# Patient Record
Sex: Male | Born: 1970 | Race: White | Hispanic: No | Marital: Married | Smoking: Former smoker
Health system: Southern US, Community
[De-identification: ages and names within clinical notes are randomized; demographics above are authoritative.]

## PROBLEM LIST (undated history)

## (undated) DIAGNOSIS — K589 Irritable bowel syndrome without diarrhea: Secondary | ICD-10-CM

## (undated) DIAGNOSIS — K219 Gastro-esophageal reflux disease without esophagitis: Secondary | ICD-10-CM

## (undated) HISTORY — DX: Irritable bowel syndrome, unspecified: K58.9

---

## 1997-08-06 HISTORY — PX: APPENDECTOMY: SHX54

## 2010-08-06 HISTORY — PX: NASAL SEPTUM SURGERY: SHX37

## 2015-11-02 ENCOUNTER — Other Ambulatory Visit: Payer: Self-pay | Admitting: Internal Medicine

## 2015-11-02 DIAGNOSIS — M546 Pain in thoracic spine: Secondary | ICD-10-CM

## 2015-11-04 ENCOUNTER — Ambulatory Visit
Admission: RE | Admit: 2015-11-04 | Discharge: 2015-11-04 | Disposition: A | Discharge status: Home / Self Care | Payer: BLUE CROSS/BLUE SHIELD | Source: Ambulatory Visit | Attending: Internal Medicine | Admitting: Internal Medicine

## 2015-11-04 DIAGNOSIS — M546 Pain in thoracic spine: Secondary | ICD-10-CM | POA: Diagnosis present

## 2015-11-04 DIAGNOSIS — K7689 Other specified diseases of liver: Secondary | ICD-10-CM | POA: Diagnosis not present

## 2015-11-04 DIAGNOSIS — K8020 Calculus of gallbladder without cholecystitis without obstruction: Secondary | ICD-10-CM | POA: Insufficient documentation

## 2015-11-10 ENCOUNTER — Ambulatory Visit: Payer: Self-pay | Admitting: General Surgery

## 2015-11-10 ENCOUNTER — Encounter: Payer: Self-pay | Admitting: General Surgery

## 2015-11-10 ENCOUNTER — Ambulatory Visit (INDEPENDENT_AMBULATORY_CARE_PROVIDER_SITE_OTHER): Payer: BLUE CROSS/BLUE SHIELD | Admitting: General Surgery

## 2015-11-10 VITALS — BP 124/78 | HR 78 | Resp 12 | Ht 74.0 in | Wt 238.0 lb

## 2015-11-10 DIAGNOSIS — K8020 Calculus of gallbladder without cholecystitis without obstruction: Secondary | ICD-10-CM

## 2015-11-10 NOTE — Patient Instructions (Addendum)
The patient is aware to call back for any questions or concerns.  Laparoscopic Cholecystectomy Laparoscopic cholecystectomy is surgery to remove the gallbladder. The gallbladder is located in the upper right part of the abdomen, behind the liver. It is a storage sac for bile, which is produced in the liver. Bile aids in the digestion and absorption of fats. Cholecystectomy is often done for inflammation of the gallbladder (cholecystitis). This condition is usually caused by a buildup of gallstones (cholelithiasis) in the gallbladder. Gallstones can block the flow of bile, and that can result in inflammation and pain. In severe cases, emergency surgery may be required. If emergency surgery is not required, you will have time to prepare for the procedure. Laparoscopic surgery is an alternative to open surgery. Laparoscopic surgery has a shorter recovery time. Your common bile duct may also need to be examined during the procedure. If stones are found in the common bile duct, they may be removed. LET YOUR HEALTH CARE PROVIDER KNOW ABOUT:  Any allergies you have.  All medicines you are taking, including vitamins, herbs, eye drops, creams, and over-the-counter medicines.  Previous problems you or members of your family have had with the use of anesthetics.  Any blood disorders you have.  Previous surgeries you have had.  Any medical conditions you have. RISKS AND COMPLICATIONS Generally, this is a safe procedure. However, problems may occur, including:  Infection.  Bleeding.  Allergic reactions to medicines.  Damage to other structures or organs.  A stone remaining in the common bile duct.  A bile leak from the cyst duct that is clipped when your gallbladder is removed.  The need to convert to open surgery, which requires a larger incision in the abdomen. This may be necessary if your surgeon thinks that it is not safe to continue with a laparoscopic procedure. BEFORE THE  PROCEDURE  Ask your health care provider about:  Changing or stopping your regular medicines. This is especially important if you are taking diabetes medicines or blood thinners.  Taking medicines such as aspirin and ibuprofen. These medicines can thin your blood. Do not take these medicines before your procedure if your health care provider instructs you not to.  Follow instructions from your health care provider about eating or drinking restrictions.  Let your health care provider know if you develop a cold or an infection before surgery.  Plan to have someone take you home after the procedure.  Ask your health care provider how your surgical site will be marked or identified.  You may be given antibiotic medicine to help prevent infection. PROCEDURE  To reduce your risk of infection:  Your health care team will wash or sanitize their hands.  Your skin will be washed with soap.  An IV tube may be inserted into one of your veins.  You will be given a medicine to make you fall asleep (general anesthetic).  A breathing tube will be placed in your mouth.  The surgeon will make several small cuts (incisions) in your abdomen.  A thin, lighted tube (laparoscope) that has a tiny camera on the end will be inserted through one of the small incisions. The camera on the laparoscope will send a picture to a TV screen (monitor) in the operating room. This will give the surgeon a good view inside your abdomen.  A gas will be pumped into your abdomen. This will expand your abdomen to give the surgeon more room to perform the surgery.  Other tools that are needed   for the procedure will be inserted through the other incisions. The gallbladder will be removed through one of the incisions.  After your gallbladder has been removed, the incisions will be closed with stitches (sutures), staples, or skin glue.  Your incisions may be covered with a bandage (dressing). The procedure may vary among  health care providers and hospitals. AFTER THE PROCEDURE  Your blood pressure, heart rate, breathing rate, and blood oxygen level will be monitored often until the medicines you were given have worn off.  You will be given medicines as needed to control your pain.   This information is not intended to replace advice given to you by your health care provider. Make sure you discuss any questions you have with your health care provider.   Document Released: 07/23/2005 Document Revised: 04/13/2015 Document Reviewed: 03/04/2013 Elsevier Interactive Patient Education 2016 Elsevier Inc.  

## 2015-11-10 NOTE — Progress Notes (Signed)
Patient ID: Clifford Phillips, male   DOB: [birth year], 45 y.o.   MRN: [id number]  Chief Complaint  Patient presents with  . Abdominal Pain    HPI Clifford Phillips is a 45 y.o. male here today for a evaluation of abdominal pain. He states he had one episode of abdominal pain lasting 5 hours in his epipastic area 6 weeks ago. He had eaten a breakfast burrito that morning prior to the pain.  Had an ultrasound done on 11/04/15.  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  I person reviewed the patient's history.  HPI  Past Medical History  Diagnosis Date  . IBS (irritable bowel syndrome)     Past Surgical History  Procedure Laterality Date  . Appendectomy  1999    No family history on file.  Social History Social History  Substance Use Topics  . Smoking status: Former Smoker -- 1.00 packs/day for 10 years    Types: Cigarettes  . Smokeless tobacco: None  . Alcohol Use: 0.6 oz/week    1 Standard drinks or equivalent per week    No Known Allergies  Current Outpatient Prescriptions  Medication Sig Dispense Refill  . CARAFATE 1 GM/10ML suspension Reported on 11/10/2015  0  . omeprazole (PRILOSEC) 20 MG capsule Reported on 11/10/2015  98   No current facility-administered medications for this visit.    Review of Systems Review of Systems  Constitutional: Negative.   Respiratory: Negative.   Cardiovascular: Negative.   Gastrointestinal: Positive for abdominal pain. Negative for nausea, vomiting and diarrhea.    Blood pressure 124/78, pulse 78, resp. rate 12, height  (1.88 m), weight 238 lb (107.956 kg).  Physical Exam Physical Exam  Constitutional: He is oriented to person, place, and time. He appears well-developed and well-nourished.  HENT:  Mouth/Throat: Oropharynx is clear and moist.  Eyes: Conjunctivae are normal.  Neck: Neck supple.  Cardiovascular: Normal rate, regular rhythm and normal heart sounds.    Pulmonary/Chest: Effort normal and breath sounds normal.  Abdominal: Soft. Normal appearance and bowel sounds are normal. There is no tenderness.  Skin tag umbilical area.  Lymphadenopathy:    He has no cervical adenopathy.  Neurological: He is alert and oriented to person, place, and time.  Skin: Skin is warm and dry.  Psychiatric: His behavior is normal.    Data Reviewed Abdominal ultrasound dated 11/04/2015 was reviewed. Multiple stones. No gallbladder wall thickening. Common bile duct 3.1 mm. Left hepatic lobe cyst.  Assessment    Cholelithiasis, single episode of likely biliary colic.    Plan    the patient has been doing a bit of reading about gallbladder disease. At this time while his sister had her gallbladder out,  a cousin is taking a watchful waiting approach and he is leaning in that direction.  Pros and cons of elective cholecystectomy were reviewed.      Laparoscopic Cholecystectomy with Intraoperative Cholangiogram. The procedure, including it's potential risks and complications (including but not limited to infection, bleeding, injury to intra-abdominal organs or bile ducts, bile leak, poor cosmetic result, sepsis and death) were discussed with the patient in detail. Non-operative options, including their inherent risks (acute calculous cholecystitis with possible choledocholithiasis or gallstone pancreatitis, with the risk of ascending cholangitis, sepsis, and death) were discussed as well. The patient expressed and understanding of what we discussed and wishes to to call back when he decides to proceed with laparoscopic cholecystectomy. The  patient further understands that if it is technically not possible, or it is unsafe to proceed laparoscopically, that I will convert to an open cholecystectomy.  The patient will contact the office if he desires to proceed with surgical intervention.   PCP:  Lauro RegulusAnderson, Marshall W This information has been scribed by Ples SpecterJessica  Qualls CMA.    [name], Mariell Nester W 11/10/2015, 1:53 PM

## 2016-03-20 ENCOUNTER — Encounter: Payer: Self-pay | Admitting: General Surgery

## 2016-03-20 ENCOUNTER — Ambulatory Visit (INDEPENDENT_AMBULATORY_CARE_PROVIDER_SITE_OTHER): Payer: BLUE CROSS/BLUE SHIELD | Admitting: General Surgery

## 2016-03-20 VITALS — BP 138/76 | HR 70 | Resp 14 | Ht 74.0 in | Wt 243.0 lb

## 2016-03-20 DIAGNOSIS — K8020 Calculus of gallbladder without cholecystitis without obstruction: Secondary | ICD-10-CM

## 2016-03-20 MED ORDER — HYDROCODONE-ACETAMINOPHEN 10-325 MG PO TABS: 1.0000 | ORAL_TABLET | ORAL | 0 refills | Status: DC | PRN

## 2016-03-20 NOTE — Progress Notes (Signed)
Patient ID: Clifford GravesJoseph D Phillips, male   DOB: 06/26/1971, 45 y.o.   MRN: [id number]  Chief Complaint  Patient presents with  . Pre-op Exam    laparoscopic cholecystectomy     HPI Clifford Phillips is a 45 y.o. male.  Here today for preop laparoscopic cholecystectomy for 03-29-16. He states the last episode was about 2 weeks ago. The pain was between shoulder blades and states it lasted about 11 hours. Tramadol did help.  The patient was seen in April 2017 for initial evaluation. At that time he had planned on a "wait-and-see approach. He reported today that he is tired of waiting.   HPI  Past Medical History:  Diagnosis Date  . IBS (irritable bowel syndrome)     Past Surgical History:  Procedure Laterality Date  . APPENDECTOMY  1999    No family history on file.  Social History Social History  Substance Use Topics  . Smoking status: Former Smoker    Packs/day: 1.00    Years: 10.00    Types: Cigarettes  . Smokeless tobacco: Never Used  . Alcohol use 0.6 oz/week    1 Standard drinks or equivalent per week    No Known Allergies  Current Outpatient Prescriptions  Medication Sig Dispense Refill  . HYDROcodone-acetaminophen (NORCO) 10-325 MG tablet Take 1-2 tablets by mouth every 4 (four) hours as needed. 30 tablet 0   No current facility-administered medications for this visit.     Review of Systems Review of Systems  Constitutional: Negative.   Respiratory: Negative.   Cardiovascular: Negative.   Gastrointestinal: Positive for nausea. Negative for vomiting.    Blood pressure 138/76, pulse 70, resp. rate 14, height 6\' 2"  (1.88 m), weight 243 lb (110.2 kg).  Physical Exam Physical Exam  Constitutional: He is oriented to person, place, and time. He appears well-developed and well-nourished.  HENT:  Mouth/Throat: Oropharynx is clear and moist.  Eyes: Conjunctivae are normal. No scleral icterus.  Neck: Neck supple.  Cardiovascular: Normal rate, regular rhythm and normal  heart sounds.   Pulmonary/Chest: Effort normal and breath sounds normal.  Abdominal: Soft. Bowel sounds are normal. There is no tenderness.  Lymphadenopathy:    He has no cervical adenopathy.  Neurological: He is alert and oriented to person, place, and time.  Skin: Skin is warm and dry.  Psychiatric: His behavior is normal.     Assessment    Symptomatic cholelithiasis    Plan         Laparoscopic Cholecystectomy with Intraoperative Cholangiogram. The procedure, including it's potential risks and complications (including but not limited to infection, bleeding, injury to intra-abdominal organs or bile ducts, bile leak, poor cosmetic result, sepsis and death) were discussed with the patient in detail. Non-operative options, including their inherent risks (acute calculous cholecystitis with possible choledocholithiasis or gallstone pancreatitis, with the risk of ascending cholangitis, sepsis, and death) were discussed as well. The patient expressed and understanding of what we discussed and wishes to proceed with laparoscopic cholecystectomy. The patient further understands that if it is technically not possible, or it is unsafe to proceed laparoscopically, that I will convert to an open cholecystectomy.  [facility]. He will pre admit by phone. The patient is aware of date and instructions.   This information has been scribed by Dorathy DaftMarsha Hatch RN, BSN,BC.   [name], Clifford Phillips 03/21/2016, 4:58 PM

## 2016-03-20 NOTE — Patient Instructions (Addendum)
The patient is aware to call back for any questions or concerns.  Laparoscopic Cholecystectomy Laparoscopic cholecystectomy is surgery to remove the gallbladder. The gallbladder is located in the upper right part of the abdomen, behind the liver. It is a storage sac for bile, which is produced in the liver. Bile aids in the digestion and absorption of fats. Cholecystectomy is often done for inflammation of the gallbladder (cholecystitis). This condition is usually caused by a buildup of gallstones (cholelithiasis) in the gallbladder. Gallstones can block the flow of bile, and that can result in inflammation and pain. In severe cases, emergency surgery may be required. If emergency surgery is not required, you will have time to prepare for the procedure. Laparoscopic surgery is an alternative to open surgery. Laparoscopic surgery has a shorter recovery time. Your common bile duct may also need to be examined during the procedure. If stones are found in the common bile duct, they may be removed. LET [facility] CARE PROVIDER KNOW ABOUT:  Any allergies you have.  All medicines you are taking, including vitamins, herbs, eye drops, creams, and over-the-counter medicines.  Previous problems you or members of your family have had with the use of anesthetics.  Any blood disorders you have.  Previous surgeries you have had.  Any medical conditions you have. RISKS AND COMPLICATIONS Generally, this is a safe procedure. However, problems may occur, including:  Infection.  Bleeding.  Allergic reactions to medicines.  Damage to other structures or organs.  A stone remaining in the common bile duct.  A bile leak from the cyst duct that is clipped when your gallbladder is removed.  The need to convert to open surgery, which requires a larger incision in the abdomen. This may be necessary if your surgeon thinks that it is not safe to continue with a laparoscopic procedure. BEFORE THE  PROCEDURE  Ask your health care provider about:  Changing or stopping your regular medicines. This is especially important if you are taking diabetes medicines or blood thinners.  Taking medicines such as aspirin and ibuprofen. These medicines can thin your blood. Do not take these medicines before your procedure if your health care provider instructs you not to.  Follow instructions from your health care provider about eating or drinking restrictions.  Let your health care provider know if you develop a cold or an infection before surgery.  Plan to have someone take you home after the procedure.  Ask your health care provider how your surgical site will be marked or identified.  You may be given antibiotic medicine to help prevent infection. PROCEDURE  To reduce your risk of infection:  Your health care team will wash or sanitize their hands.  Your skin will be washed with soap.  An IV tube may be inserted into one of your veins.  You will be given a medicine to make you fall asleep (general anesthetic).  A breathing tube will be placed in your mouth.  The surgeon will make several small cuts (incisions) in your abdomen.  A thin, lighted tube (laparoscope) that has a tiny camera on the end will be inserted through one of the small incisions. The camera on the laparoscope will send a picture to a TV screen (monitor) in the operating room. This will give the surgeon a good view inside your abdomen.  A gas will be pumped into your abdomen. This will expand your abdomen to give the surgeon more room to perform the surgery.  Other tools that are needed  for the procedure will be inserted through the other incisions. The gallbladder will be removed through one of the incisions.  After your gallbladder has been removed, the incisions will be closed with stitches (sutures), staples, or skin glue.  Your incisions may be covered with a bandage (dressing). The procedure may vary among  health care providers and hospitals. AFTER THE PROCEDURE  Your blood pressure, heart rate, breathing rate, and blood oxygen level will be monitored often until the medicines you were given have worn off.  You will be given medicines as needed to control your pain.   This information is not intended to replace advice given to you by your health care provider. Make sure you discuss any questions you have with your health care provider.   Document Released: 07/23/2005 Document Revised: 04/13/2015 Document Reviewed: 03/04/2013 Elsevier Interactive Patient Education [employer]2016 Elsevier Inc.  The patent [facility]. He will pre admit by phone. The patient is aware of date and instructions.

## 2016-03-21 ENCOUNTER — Other Ambulatory Visit: Payer: Self-pay | Admitting: General Surgery

## 2016-03-22 ENCOUNTER — Encounter
Admission: RE | Admit: 2016-03-22 | Discharge: 2016-03-22 | Disposition: A | Discharge status: Home / Self Care | Payer: BLUE CROSS/BLUE SHIELD | Source: Ambulatory Visit | Attending: General Surgery | Admitting: General Surgery

## 2016-03-22 HISTORY — DX: Gastro-esophageal reflux disease without esophagitis: K21.9

## 2016-03-22 NOTE — Patient Instructions (Signed)
  Your procedure is scheduled on: 03-29-16 Report to Same Day Surgery 2nd floor medical mall To find out your arrival time please call [phone] between 1PM - 3PM on 03-28-16  Remember: Instructions that are not followed completely may result in serious medical risk, up to and including death, or upon the discretion of your surgeon and anesthesiologist your surgery may need to be rescheduled.    _x___ 1. Do not eat food or drink liquids after midnight. No gum chewing or hard candies.     __x__ 2. No Alcohol for 24 hours before or after surgery.   __x__3. No Smoking for 24 prior to surgery.   ____  4. Bring all medications with you on the day of surgery if instructed.    __x__ 5. Notify your doctor if there is any change in your medical condition     (cold, fever, infections).     Do not wear jewelry, make-up, hairpins, clips or nail polish.  Do not wear lotions, powders, or perfumes. You may wear deodorant.  Do not shave 48 hours prior to surgery. Men may shave face and neck.  Do not bring valuables to the hospital.    [facility] is not responsible for any belongings or valuables.               Contacts, dentures or bridgework may not be worn into surgery.  Leave your suitcase in the car. After surgery it may be brought to your room.  For patients admitted to the hospital, discharge time is determined by your treatment team.   Patients discharged the day of surgery will not be allowed to drive home.    Please read over the following fact sheets that you were given:   [facility] Preparing for Surgery and or MRSA Information   _x___ Take these medicines the morning of surgery with A SIP OF WATER:    1. pepcid  2. Take a pepcid on Wednesday night before bed  3.  4.  5.  6.  ____ Fleet Enema (as directed)   ____ Use CHG Soap or sage wipes as directed on instruction sheet   ____ Use inhalers on the day of surgery and bring to hospital day of surgery  ____ Stop metformin  2 days prior to surgery    ____ Take 1/2 of usual insulin dose the night before surgery and none on the morning of surgery.   ____ Stop aspirin or coumadin, or plavix  _x__ Stop Anti-inflammatories such as Advil, Aleve, Ibuprofen, Motrin, Naproxen,          Naprosyn, Goodies powders or aspirin products. Ok to take Tylenol.   ____ Stop supplements until after surgery.    ____ Bring C-Pap to the hospital.

## 2016-03-29 ENCOUNTER — Ambulatory Visit
Admission: RE | Admit: 2016-03-29 | Discharge: 2016-03-29 | Disposition: A | Discharge status: Home / Self Care | Payer: BLUE CROSS/BLUE SHIELD | Source: Ambulatory Visit | Attending: General Surgery | Admitting: General Surgery

## 2016-03-29 ENCOUNTER — Encounter: Payer: Self-pay | Admitting: Anesthesiology

## 2016-03-29 ENCOUNTER — Ambulatory Visit: Payer: BLUE CROSS/BLUE SHIELD | Admitting: Certified Registered Nurse Anesthetist

## 2016-03-29 ENCOUNTER — Encounter
Admission: RE | Disposition: A | Discharge status: Home / Self Care | Payer: Self-pay | Source: Ambulatory Visit | Attending: General Surgery

## 2016-03-29 ENCOUNTER — Ambulatory Visit: Payer: BLUE CROSS/BLUE SHIELD

## 2016-03-29 DIAGNOSIS — K589 Irritable bowel syndrome without diarrhea: Secondary | ICD-10-CM | POA: Diagnosis not present

## 2016-03-29 DIAGNOSIS — K8044 Calculus of bile duct with chronic cholecystitis without obstruction: Secondary | ICD-10-CM | POA: Diagnosis not present

## 2016-03-29 DIAGNOSIS — K8010 Calculus of gallbladder with chronic cholecystitis without obstruction: Secondary | ICD-10-CM | POA: Insufficient documentation

## 2016-03-29 DIAGNOSIS — Z87891 Personal history of nicotine dependence: Secondary | ICD-10-CM | POA: Insufficient documentation

## 2016-03-29 DIAGNOSIS — Z419 Encounter for procedure for purposes other than remedying health state, unspecified: Secondary | ICD-10-CM

## 2016-03-29 DIAGNOSIS — K219 Gastro-esophageal reflux disease without esophagitis: Secondary | ICD-10-CM | POA: Diagnosis not present

## 2016-03-29 HISTORY — PX: CHOLECYSTECTOMY: SHX55

## 2016-03-29 SURGERY — LAPAROSCOPIC CHOLECYSTECTOMY WITH INTRAOPERATIVE CHOLANGIOGRAM
Anesthesia: General | Wound class: Clean Contaminated

## 2016-03-29 MED ORDER — LACTATED RINGERS IV SOLN
INTRAVENOUS | Status: DC
  Administered 2016-03-29 (×2): via INTRAVENOUS

## 2016-03-29 MED ORDER — ONDANSETRON HCL 4 MG/2ML IJ SOLN
INTRAMUSCULAR | Status: DC | PRN
  Administered 2016-03-29: 4 mg via INTRAVENOUS

## 2016-03-29 MED ORDER — ONDANSETRON HCL 4 MG/2ML IJ SOLN
INTRAMUSCULAR | Status: AC
  Filled 2016-03-29: qty 2

## 2016-03-29 MED ORDER — CEFAZOLIN SODIUM-DEXTROSE 2-3 GM-% IV SOLR
INTRAVENOUS | Status: DC | PRN
  Administered 2016-03-29: 2 g via INTRAVENOUS

## 2016-03-29 MED ORDER — SODIUM CHLORIDE 0.9 % IV SOLN
INTRAVENOUS | Status: DC | PRN
  Administered 2016-03-29: 20 mL

## 2016-03-29 MED ORDER — FENTANYL CITRATE (PF) 100 MCG/2ML IJ SOLN
INTRAMUSCULAR | Status: AC
  Filled 2016-03-29: qty 2

## 2016-03-29 MED ORDER — ACETAMINOPHEN 10 MG/ML IV SOLN
INTRAVENOUS | Status: AC
  Filled 2016-03-29: qty 100

## 2016-03-29 MED ORDER — FENTANYL CITRATE (PF) 100 MCG/2ML IJ SOLN
INTRAMUSCULAR | Status: DC | PRN
  Administered 2016-03-29: 100 ug via INTRAVENOUS

## 2016-03-29 MED ORDER — PROPOFOL 10 MG/ML IV BOLUS
INTRAVENOUS | Status: DC | PRN
  Administered 2016-03-29: 200 mg via INTRAVENOUS

## 2016-03-29 MED ORDER — ACETAMINOPHEN 10 MG/ML IV SOLN
INTRAVENOUS | Status: DC | PRN
  Administered 2016-03-29: 1000 mg via INTRAVENOUS

## 2016-03-29 MED ORDER — SUCCINYLCHOLINE CHLORIDE 20 MG/ML IJ SOLN
INTRAMUSCULAR | Status: DC | PRN
  Administered 2016-03-29: 120 mg via INTRAVENOUS

## 2016-03-29 MED ORDER — KETOROLAC TROMETHAMINE 30 MG/ML IJ SOLN
INTRAMUSCULAR | Status: DC | PRN
  Administered 2016-03-29: 30 mg via INTRAVENOUS

## 2016-03-29 MED ORDER — FAMOTIDINE 20 MG PO TABS
ORAL_TABLET | ORAL | Status: AC
  Filled 2016-03-29: qty 1

## 2016-03-29 MED ORDER — HYDROMORPHONE HCL 1 MG/ML IJ SOLN
INTRAMUSCULAR | Status: AC
  Filled 2016-03-29: qty 1

## 2016-03-29 MED ORDER — HYDROMORPHONE HCL 1 MG/ML IJ SOLN
0.2500 mg | INTRAMUSCULAR | Status: DC | PRN
  Administered 2016-03-29: 0.5 mg via INTRAVENOUS

## 2016-03-29 MED ORDER — DEXAMETHASONE SODIUM PHOSPHATE 10 MG/ML IJ SOLN
INTRAMUSCULAR | Status: DC | PRN
  Administered 2016-03-29: 10 mg via INTRAVENOUS

## 2016-03-29 MED ORDER — MIDAZOLAM HCL 2 MG/2ML IJ SOLN
INTRAMUSCULAR | Status: DC | PRN
  Administered 2016-03-29: 2 mg via INTRAVENOUS

## 2016-03-29 MED ORDER — ROCURONIUM BROMIDE 100 MG/10ML IV SOLN
INTRAVENOUS | Status: DC | PRN
  Administered 2016-03-29 (×2): 20 mg via INTRAVENOUS

## 2016-03-29 MED ORDER — ONDANSETRON HCL 4 MG/2ML IJ SOLN
4.0000 mg | Freq: Once | INTRAMUSCULAR | Status: AC | PRN
  Administered 2016-03-29: 4 mg via INTRAVENOUS

## 2016-03-29 MED ORDER — SUGAMMADEX SODIUM 500 MG/5ML IV SOLN
INTRAVENOUS | Status: DC | PRN
  Administered 2016-03-29: 220 mg via INTRAVENOUS

## 2016-03-29 MED ORDER — HYDROCODONE-ACETAMINOPHEN 5-325 MG PO TABS: 1.0000 | ORAL_TABLET | ORAL | 0 refills | Status: DC | PRN

## 2016-03-29 MED ORDER — FENTANYL CITRATE (PF) 100 MCG/2ML IJ SOLN
25.0000 ug | INTRAMUSCULAR | Status: AC | PRN
  Administered 2016-03-29 (×6): 25 ug via INTRAVENOUS

## 2016-03-29 MED ORDER — LIDOCAINE HCL (CARDIAC) 20 MG/ML IV SOLN
INTRAVENOUS | Status: DC | PRN
  Administered 2016-03-29: 50 mg via INTRAVENOUS

## 2016-03-29 MED ORDER — SODIUM CHLORIDE 0.9 % IJ SOLN
INTRAMUSCULAR | Status: AC
  Filled 2016-03-29: qty 50

## 2016-03-29 SURGICAL SUPPLY — 44 items
APPLIER CLIP ROT 10 11.4 M/L (STAPLE) ×3
BLADE SURG 11 STRL SS SAFETY (MISCELLANEOUS) ×3 IMPLANT
CANISTER SUCT 1200ML W/VALVE (MISCELLANEOUS) ×3 IMPLANT
CANNULA DILATOR  5MM W/SLV (CANNULA) ×2
CANNULA DILATOR 10 W/SLV (CANNULA) ×2 IMPLANT
CANNULA DILATOR 10MM W/SLV (CANNULA) ×1
CANNULA DILATOR 5 W/SLV (CANNULA) ×4 IMPLANT
CATH CHOLANG 76X19 KUMAR (CATHETERS) ×3 IMPLANT
CHLORAPREP W/TINT 26ML (MISCELLANEOUS) ×3 IMPLANT
CLIP APPLIE ROT 10 11.4 M/L (STAPLE) ×1 IMPLANT
CLOSURE WOUND 1/2 X4 (GAUZE/BANDAGES/DRESSINGS) ×1
CONRAY 60ML FOR OR (MISCELLANEOUS) ×3 IMPLANT
DISSECTOR KITTNER STICK (MISCELLANEOUS) IMPLANT
DISSECTORS/KITTNER STICK (MISCELLANEOUS)
DRAPE SHEET LG 3/4 BI-LAMINATE (DRAPES) ×3 IMPLANT
DRESSING TELFA 4X3 1S ST N-ADH (GAUZE/BANDAGES/DRESSINGS) ×3 IMPLANT
DRSG TEGADERM 2-3/8X2-3/4 SM (GAUZE/BANDAGES/DRESSINGS) ×12 IMPLANT
ELECT REM PT RETURN 9FT ADLT (ELECTROSURGICAL) ×3
ELECTRODE REM PT RTRN 9FT ADLT (ELECTROSURGICAL) ×1 IMPLANT
ENDOPOUCH RETRIEVER 10 (MISCELLANEOUS) IMPLANT
GLOVE BIO SURGEON STRL SZ7.5 (GLOVE) ×3 IMPLANT
GLOVE INDICATOR 8.0 STRL GRN (GLOVE) ×3 IMPLANT
GOWN STRL REUS W/ TWL LRG LVL3 (GOWN DISPOSABLE) ×3 IMPLANT
GOWN STRL REUS W/TWL LRG LVL3 (GOWN DISPOSABLE) ×6
IRRIGATION STRYKERFLOW (MISCELLANEOUS) ×1 IMPLANT
IRRIGATOR STRYKERFLOW (MISCELLANEOUS) ×3
IV LACTATED RINGERS 1000ML (IV SOLUTION) ×3 IMPLANT
KIT RM TURNOVER STRD PROC AR (KITS) ×3 IMPLANT
LABEL OR SOLS (LABEL) ×3 IMPLANT
NDL INSUFF ACCESS 14 VERSASTEP (NEEDLE) ×3 IMPLANT
NS IRRIG 500ML POUR BTL (IV SOLUTION) ×3 IMPLANT
PACK LAP CHOLECYSTECTOMY (MISCELLANEOUS) ×3 IMPLANT
SCISSORS METZENBAUM CVD 33 (INSTRUMENTS) ×3 IMPLANT
SEAL FOR SCOPE WARMER C3101 (MISCELLANEOUS) IMPLANT
STRIP CLOSURE SKIN 1/2X4 (GAUZE/BANDAGES/DRESSINGS) ×2 IMPLANT
SUT PDS AB 0 CT1 27 (SUTURE) ×3 IMPLANT
SUT VIC AB 0 CT2 27 (SUTURE) ×3 IMPLANT
SUT VIC AB 4-0 FS2 27 (SUTURE) ×3 IMPLANT
SUT VIC AB 4-0 SH 27 (SUTURE) ×2
SUT VIC AB 4-0 SH 27XANBCTRL (SUTURE) ×1 IMPLANT
SWABSTK COMLB BENZOIN TINCTURE (MISCELLANEOUS) ×3 IMPLANT
TROCAR XCEL NON-BLD 11X100MML (ENDOMECHANICALS) ×3 IMPLANT
TUBING INSUFFLATOR HI FLOW (MISCELLANEOUS) ×3 IMPLANT
WATER STERILE IRR 1000ML POUR (IV SOLUTION) IMPLANT

## 2016-03-29 NOTE — Anesthesia Postprocedure Evaluation (Signed)
Anesthesia Post Note  Patient: Clifford GravesJoseph D Phillips  Procedure(s) Performed: Procedure(s) (LRB): LAPAROSCOPIC CHOLECYSTECTOMY WITH INTRAOPERATIVE CHOLANGIOGRAM (N/A)  Patient location during evaluation: PACU Anesthesia Type: General Level of consciousness: awake and alert and oriented Pain management: pain level controlled Vital Signs Assessment: post-procedure vital signs reviewed and stable Respiratory status: spontaneous breathing, nonlabored ventilation and respiratory function stable Cardiovascular status: blood pressure returned to baseline and stable Postop Assessment: no signs of nausea or vomiting Anesthetic complications: no    Last Vitals:  Vitals:   03/29/16 1615 03/29/16 1630  BP:  137/86  Pulse:  68  Resp:  16  Temp: 36.4 C 36.9 C    Last Pain:  Vitals:   03/29/16 1630  TempSrc: Oral  PainSc: 2                  Ilona Colley

## 2016-03-29 NOTE — H&P (Signed)
Clifford GravesJoseph D Phillips [id number] 03/09/1971     HPI: Healthy 45 year old male for elective cholecystectomy. He reports she's had some mild cough without significant sputum production over the last day or 2. No fever or chills.  Prescriptions Prior to Admission  Medication Sig Dispense Refill Last Dose  . famotidine-calcium carbonate-magnesium hydroxide (PEPCID COMPLETE) 10-800-165 MG chewable tablet Chew 1 tablet by mouth daily as needed.   03/29/2016 at Unknown time   No Known Allergies Past Medical History:  Diagnosis Date  . GERD (gastroesophageal reflux disease)    OCC  . IBS (irritable bowel syndrome)    Past Surgical History:  Procedure Laterality Date  . APPENDECTOMY  1999  . NASAL SEPTUM SURGERY  2012   Social History   Social History  . Marital status: Married    Spouse name: N/A  . Number of children: N/A  . Years of education: N/A   Occupational History  . Not on file.   Social History Main Topics  . Smoking status: Former Smoker    Packs/day: 1.00    Years: 10.00    Types: Cigarettes    Quit date: 03/22/2001  . Smokeless tobacco: Never Used  . Alcohol use 0.6 oz/week    1 Standard drinks or equivalent per week     Comment: OCC  . Drug use: No  . Sexual activity: Not on file   Other Topics Concern  . Not on file   Social History Narrative  . No narrative on file   Social History   Social History Narrative  . No narrative on file     ROS: Negative.     PE: HEENT: Negative. Lungs: Clear to auscultation. Cardio: RR.  Assessment/Plan:  Proceed with planned cholecystectomy.  [name], Clifford W 03/29/2016

## 2016-03-29 NOTE — Anesthesia Procedure Notes (Signed)
Procedure Name: Intubation Date/Time: 03/29/2016 2:27 PM Performed by: Ginger CarneMICHELET, Armstead Heiland Pre-anesthesia Checklist: Patient identified, Emergency Drugs available, Suction available, Patient being monitored and Timeout performed Patient Re-evaluated:Patient Re-evaluated prior to inductionOxygen Delivery Method: Circle system utilized Preoxygenation: Pre-oxygenation with 100% oxygen Intubation Type: IV induction Ventilation: Mask ventilation without difficulty and Oral airway inserted - appropriate to patient size Laryngoscope Size: Hyacinth MeekerMiller and 2 Grade View: Grade I Tube type: Oral Tube size: 7.5 mm Number of attempts: 1 Airway Equipment and Method: Stylet Placement Confirmation: ETT inserted through vocal cords under direct vision,  positive ETCO2 and breath sounds checked- equal and bilateral Secured at: 22 cm Tube secured with: Tape

## 2016-03-29 NOTE — Anesthesia Preprocedure Evaluation (Addendum)
Anesthesia Evaluation  Patient identified by MRN, date of birth, ID band Patient awake    Reviewed: Allergy & Precautions, NPO status , Patient's Chart, lab work & pertinent test results  Airway Mallampati: II  TM Distance: >3 FB     Dental no notable dental hx. (+) Chipped   Pulmonary former smoker,    Pulmonary exam normal        Cardiovascular negative cardio ROS Normal cardiovascular exam     Neuro/Psych negative neurological ROS  negative psych ROS   GI/Hepatic Neg liver ROS, GERD  Medicated,IBS   Endo/Other  negative endocrine ROS  Renal/GU negative Renal ROS  negative genitourinary   Musculoskeletal negative musculoskeletal ROS (+)   Abdominal Normal abdominal exam  (+)   Peds negative pediatric ROS (+)  Hematology negative hematology ROS (+)   Anesthesia Other Findings   Reproductive/Obstetrics                            Anesthesia Physical Anesthesia Plan  ASA: II  Anesthesia Plan: General   Post-op Pain Management:    Induction: Intravenous  Airway Management Planned: Oral ETT  Additional Equipment:   Intra-op Plan:   Post-operative Plan: Extubation in OR  Informed Consent: I have reviewed the patients History and Physical, chart, labs and discussed the procedure including the risks, benefits and alternatives for the proposed anesthesia with the patient or authorized representative who has indicated his/her understanding and acceptance.   Dental advisory given  Plan Discussed with: CRNA and Surgeon  Anesthesia Plan Comments:         Anesthesia Quick Evaluation

## 2016-03-29 NOTE — Transfer of Care (Signed)
Immediate Anesthesia Transfer of Care Note  Patient: Clifford GravesJoseph D Phillips  Procedure(s) Performed: Procedure(s): LAPAROSCOPIC CHOLECYSTECTOMY WITH INTRAOPERATIVE CHOLANGIOGRAM (N/A)  Patient Location: PACU  Anesthesia Type:General  Level of Consciousness: sedated  Airway & Oxygen Therapy: Patient Spontanous Breathing  Post-op Assessment: Report given to RN and Post -op Vital signs reviewed and stable  Post vital signs: Reviewed and stable  Last Vitals:  Vitals:   03/29/16 1350 03/29/16 1528  BP: (!) 149/94 (!) 145/82  Pulse: 75 75  Resp: 16 14  Temp: 36.7 C 36.6 C    Last Pain:  Vitals:   03/29/16 1350  TempSrc: Oral         Complications: No apparent anesthesia complications

## 2016-03-29 NOTE — Discharge Instructions (Addendum)

## 2016-03-29 NOTE — Op Note (Signed)
Preoperative diagnosis: Chronic cholecystitis and cholelithiasis.  Postoperative diagnosis: Same.  Operative procedure: Laparoscopic cholecystectomy, attempted cholangiograms.  Operating surgeon: Donnalee CurryJeffrey Milagro Belmares, M.D.  Anesthesia: Gen. endotracheal.  Estimated blood loss: 5 mL.  Clinical note: This 45 year old male has a long history of episodic right upper quadrant pain. He is admitted for elective cholecystectomy.  Operative note: With the patient under adequate general endotracheal anesthesia the abdomen was prepped with ChloraPrep and draped. In Trendelenburg position a varies needle was placed with trans-umbilical incision. After sharing intra-abdominal location with hanging drop test the abdomen was insufflated with CO2 a 10 mmHg pressure. A 10 mm Port was expanded and inspection showed no evidence of injury from initial port placement. The patient was placed in the reverse Trendelenburg position and rolled to left. An 11 mm XL port was placed into the epigastrium. 2-5 mm Step ports were placed laterally under direct vision. Evidence of chronic inflammatory changes involving the gallbladder were identified. Adhesions between the omentum and the gallbladder taken down with cautery dissection. An enlarged cystic duct lymph node was noted. The cystic duct was cleared and a Kumar clamp placed. Aspiration the gallbladder showed thick, white, new choroid "milk of calcium" bile. Cholangiogram attempt was unsuccessful due to occlusion of the cystic duct. The cystic duct was clipped adjacent to the gallbladder. The duct was incised and milked backward with only a small amount of mucoid material retrieved. The distal end was doubly clipped and divided. The cystic artery was doubly clipped and divided. Prior to opening the cystic duct the critical view of safety was obtained and it photographed. The gallbladder was removed from the liver bed making use of hook cautery dissection. With the identification of  the obstruction the patient did receive Kefzol 2 g intravenously.  After delivering the gallbladder into an Endo Catch bag was delivered to the umbilical port site. The thick, mucoid bile was aspirated and a few small stones retrieved as well. The initial umbilical port was replaced and inspection of the abdomen from the epigastric site showed no evidence of injury from initial port placement. The right upper quadrant was irrigated with lactated Ringer solution. Good hemostasis was noted.  The abdomen was then desufflated and ports removed under direct vision. The fascia at the umbilicus was closed with a 0 PDS figure-of-eight suture. Skin incisions were closed with 4-0 Vicryl subcuticular sutures. Benzoin, Steri-Strips, Telfa and Tegaderm dressings were applied.  The patient tolerated the procedure well and was taken to the recovery room in stable condition.

## 2016-03-30 ENCOUNTER — Encounter: Payer: Self-pay | Admitting: General Surgery

## 2016-04-03 LAB — SURGICAL PATHOLOGY

## 2016-04-06 ENCOUNTER — Telehealth: Payer: Self-pay

## 2016-04-06 NOTE — Telephone Encounter (Signed)
History reviewed. Nausea followed by multiple episodes of vomiting. Had eaten [place] [employer] crackers and everything was orange in color. No blood, fever, chills, diarrhea. Better today, just sore. Reports a "knot" was palpable above the epigastric incision after all the vomiting, now gone.  Had returned to work and regular diet before his episode of right lower chest/ shoulder pain 2 days ago, which had resolved before the nausea/ vomiting spell of yesterday.  Will have a light breakfast and notify the office of how he is doing.  If tolerated, f/u as scheduled. If recurrent pain or nausea, will see him in the office today.

## 2016-04-06 NOTE — Telephone Encounter (Signed)
Reports had gas and bloating on 03/30/16 and 03/31/16. This went away and then he felt very good for the next 3 days. He reports on 04/05/16 he had severe upper abdominal pain with nausea and vomiting. He ate very little yesterday and today has not had any more nausea, but he does report that he is still tender but no pain. He does feel bloated.

## 2016-04-10 ENCOUNTER — Ambulatory Visit: Payer: BLUE CROSS/BLUE SHIELD | Admitting: General Surgery

## 2016-04-10 ENCOUNTER — Encounter: Payer: Self-pay | Admitting: General Surgery

## 2016-04-10 ENCOUNTER — Ambulatory Visit (INDEPENDENT_AMBULATORY_CARE_PROVIDER_SITE_OTHER): Payer: BLUE CROSS/BLUE SHIELD | Admitting: General Surgery

## 2016-04-10 VITALS — BP 148/86 | HR 66 | Resp 16 | Ht 74.0 in | Wt 243.0 lb

## 2016-04-10 DIAGNOSIS — K8020 Calculus of gallbladder without cholecystitis without obstruction: Secondary | ICD-10-CM

## 2016-04-10 NOTE — Progress Notes (Signed)
Patient ID: Clifford Phillips, male   DOB: 03/22/1971, 45 y.o.   MRN: [id number]  Chief Complaint  Patient presents with  . Routine Post Op    cholecystectomy     HPI Clifford Phillips is a 45 y.o. male here today for a post op evaluation done on 03/29/16. Patient states he is doing much better since being nauseous on 04/05/16. Eating and moving bowels regularly. 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. He has been cautious about lifting. HPI  Past Medical History:  Diagnosis Date  . GERD (gastroesophageal reflux disease)    OCC  . IBS (irritable bowel syndrome)     Past Surgical History:  Procedure Laterality Date  . APPENDECTOMY  1999  . CHOLECYSTECTOMY N/A 03/29/2016   Procedure: LAPAROSCOPIC CHOLECYSTECTOMY WITH INTRAOPERATIVE CHOLANGIOGRAM;  Surgeon: Earline MayotteJeffrey W Correen Bubolz, MD;  Location: [facility] ORS;  Service: General;  Laterality: N/A;  . NASAL SEPTUM SURGERY  2012    Family History  Problem Relation Age of Onset  . Cancer Father 7059    liver cancer    Social History Social History  Substance Use Topics  . Smoking status: Former Smoker    Packs/day: 1.00    Years: 10.00    Types: Cigarettes    Quit date: 03/22/2001  . Smokeless tobacco: Never Used  . Alcohol use 0.6 oz/week    1 Standard drinks or equivalent per week     Comment: OCC    No Known Allergies  Current Outpatient Prescriptions  Medication Sig Dispense Refill  . famotidine-calcium carbonate-magnesium hydroxide (PEPCID COMPLETE) 10-800-165 MG chewable tablet Chew 1 tablet by mouth daily as needed.    Marland Kitchen. HYDROcodone-acetaminophen (NORCO) 5-325 MG tablet Take 1-2 tablets by mouth every 4 (four) hours as needed for moderate pain. (Patient not taking: Reported on 04/10/2016) 30 tablet 0   No current facility-administered medications for this visit.     Review of Systems Review of Systems  Constitutional: Negative.   Respiratory: Negative.   Cardiovascular: Negative.     Blood pressure (!)  148/86, pulse 66, resp. rate 16, height 6\' 2"  (1.88 m), weight 243 lb (110.2 kg).  Physical Exam Physical Exam  Constitutional: He is oriented to person, place, and time. He appears well-developed and well-nourished.  Cardiovascular: Normal rate, regular rhythm and normal heart sounds.   Pulmonary/Chest: Effort normal and breath sounds normal.  Abdominal: Soft. Bowel sounds are normal.  mininmal thickening at incision site   Neurological: He is alert and oriented to person, place, and time.  Skin: Skin is warm and dry.    Data Reviewed A. GALLBLADDER; LAPAROSCOPIC CHOLECYSTECTOMY:  - CHRONIC CHOLECYSTITIS WITH CHOLELITHIASIS.  - NEGATIVE FOR MALIGNANCY.   Assessment    Doing well status post cholecystectomy.    Plan         Increase activity as tolerated. Proper lifting techniques reviewed.     [name], Leocadio Heal W 04/10/2016, 9:53 PM

## 2016-04-10 NOTE — Patient Instructions (Signed)
Increase activity as tolerated. Proper lifting techniques reviewed.

## 2016-04-25 ENCOUNTER — Encounter: Payer: Self-pay | Admitting: General Surgery

## 2016-04-26 NOTE — Telephone Encounter (Signed)
Patient to follow up on 05/03/16 at 4:15 pm.

## 2016-05-03 ENCOUNTER — Ambulatory Visit: Payer: BLUE CROSS/BLUE SHIELD | Admitting: General Surgery

## 2016-05-17 ENCOUNTER — Ambulatory Visit (INDEPENDENT_AMBULATORY_CARE_PROVIDER_SITE_OTHER): Payer: BLUE CROSS/BLUE SHIELD | Admitting: Urology

## 2016-05-17 ENCOUNTER — Encounter: Payer: Self-pay | Admitting: Urology

## 2016-05-17 VITALS — BP 131/79 | HR 72 | Ht 74.0 in | Wt 243.8 lb

## 2016-05-17 DIAGNOSIS — Z3009 Encounter for other general counseling and advice on contraception: Secondary | ICD-10-CM

## 2016-05-17 MED ORDER — DIAZEPAM 10 MG PO TABS: 10.0000 mg | ORAL_TABLET | Freq: Once | ORAL | 0 refills | Status: AC

## 2016-05-17 NOTE — Progress Notes (Signed)
05/17/2016 3:03 PM   Clifford Phillips 09/15/1970 [id number]  Referring provider: Kirk Ruths, MD [place] [facility] [place], [place] [address]  No chief complaint on file.   HPI: The patient is a 45 year old gentleman presents today for consultation for vasectomy. He and his wife have one healthy 75-year-old boy. He and his wife desire no further children at this point. He has no other urological issues. He denies frequency, urgency, feeling of incomplete bladder pain. He has no history of nephrolithiasis or hematuria.    He would like to have this procedure completed before the end of the year as he has met his insurance to deductible.   PMH: Past Medical History:  Diagnosis Date  . GERD (gastroesophageal reflux disease)    OCC  . IBS (irritable bowel syndrome)     Surgical History: Past Surgical History:  Procedure Laterality Date  . APPENDECTOMY  1999  . CHOLECYSTECTOMY N/A 03/29/2016   Procedure: LAPAROSCOPIC CHOLECYSTECTOMY WITH INTRAOPERATIVE CHOLANGIOGRAM;  Surgeon: Robert Bellow, MD;  Location: [facility] ORS;  Service: General;  Laterality: N/A;  . NASAL SEPTUM SURGERY  2012    Home Medications:    Medication List       Accurate as of 05/17/16  3:03 PM. Always use your most recent med list.          famotidine-calcium carbonate-magnesium hydroxide 10-800-165 MG chewable tablet Commonly known as:  PEPCID COMPLETE Chew 1 tablet by mouth daily as needed.   HYDROcodone-acetaminophen 5-325 MG tablet Commonly known as:  NORCO Take 1-2 tablets by mouth every 4 (four) hours as needed for moderate pain.       Allergies: No Known Allergies  Family History: Family History  Problem Relation Age of Onset  . Cancer Father 21    liver cancer    Social History:  reports that he quit smoking about 15 years ago. His smoking use included Cigarettes. He has a 10.00 pack-year smoking history. He has never used smokeless tobacco. He  reports that he drinks about 0.6 oz of alcohol per week . He reports that he does not use drugs.  ROS:                                        Physical Exam: There were no vitals taken for this visit.  Constitutional:  Alert and oriented, No acute distress. HEENT: [place] AT, moist mucus membranes.  Trachea midline, no masses. Cardiovascular: No clubbing, cyanosis, or edema. Respiratory: Normal respiratory effort, no increased work of breathing. GI: Abdomen is soft, nontender, nondistended, no abdominal masses GU: No CVA tenderness. Normal phallus. Testicles and equal bilaterally. Bilateral vas deferens palpable. Skin: No rashes, bruises or suspicious lesions. Lymph: No cervical or inguinal adenopathy. Neurologic: Grossly intact, no focal deficits, moving all 4 extremities. Psychiatric: Normal mood and affect.  Laboratory Data: No results found for: WBC, HGB, HCT, MCV, PLT  No results found for: CREATININE  No results found for: PSA  No results found for: TESTOSTERONE  No results found for: HGBA1C  Urinalysis No results found for: COLORURINE, APPEARANCEUR, LABSPEC, PHURINE, GLUCOSEU, HGBUR, BILIRUBINUR, KETONESUR, PROTEINUR, UROBILINOGEN, NITRITE, LEUKOCYTESUR   Assessment & Plan:    Today, we discussed what the vas deferens is, where it is located, and its function. We reviewed the procedure for vasectomy, it's risks, benefits, alternatives, and likelihood of achieving his goals. We  discussed in detail the procedure, complications, and recovery as well as the need for clearance prior to unprotected intercourse. We discussed that vasectomy does not protect against sexually transmitted diseases. We discussed that this procedure does not result in immediate sterility and that they would need to use other forms of birth control until he has been cleared with negative postvasectomy semen analyses. I explained that the procedure is considered to be permanent and that  attempts at reversal have varying degrees of success. These options include vasectomy reversal, sperm retrieval, and in vitro fertilization; these can be very expensive. We discussed the chance of postvasectomy pain syndrome which occurs in less than 5% of patients. I explained to the patient that there is no treatment to resolve this chronic pain, and that if it developed I would not be able to help resolve the issue, but that surgery is generally not needed for correction. I explained there have even been reports of systemic like illness associated with this chronic pain, and that there was no good cure. I explained that vasectomy it is not a 100% reliable form of birth control, and the risk of pregnancy after vasectomy is approximately 1 in 2000 men who had a negative postvasectomy semen analysis or rare non-motile sperm. I explained that repeat vasectomy was necessary in less than 1% of vasectomy procedures when employing the type of technique that I use. I explained that he should refrain from ejaculation for approximately one week following vasectomy. I explained that there are other options for birth control which are permanent and non-permanent; we discussed these. I explained the rates of surgical complications, such as symptomatic hematoma or infection, are low (1-2%) and vary with the surgeon's experience and criteria used to diagnose the complication.  The patient had the opportunity to ask questions to his stated satisfaction. He voiced understanding of the above factors and stated that he has read all the information provided to him and the packets and informed consent   1. Family planning -vasectomy   Return for vasectomy.  Nickie Retort, MD  [facility] [address], [place],  [address] [phone]

## 2016-06-22 ENCOUNTER — Encounter: Payer: Self-pay | Admitting: Urology

## 2016-06-22 ENCOUNTER — Ambulatory Visit (INDEPENDENT_AMBULATORY_CARE_PROVIDER_SITE_OTHER): Payer: BLUE CROSS/BLUE SHIELD | Admitting: Urology

## 2016-06-22 VITALS — BP 124/84 | HR 67 | Ht 74.0 in | Wt 245.7 lb

## 2016-06-22 DIAGNOSIS — Z3009 Encounter for other general counseling and advice on contraception: Secondary | ICD-10-CM | POA: Diagnosis not present

## 2016-06-22 MED ORDER — OXYCODONE-ACETAMINOPHEN 5-325 MG PO TABS: 1.0000 | ORAL_TABLET | ORAL | 0 refills | Status: DC | PRN

## 2016-06-22 NOTE — Progress Notes (Signed)
Bilateral Vasectomy Procedure  Pre-Procedure: - Patient's scrotum was prepped and draped for vasectomy. - The vas was palpated through the scrotal skin on the left. - 1% Xylocaine was injected into the skin and surrounding tissue for placement  - In a similar manner, the vas on the right was identified, anesthetized, and stabilized.  Procedure: - A scalpel was used to make a 1 cm incision in his left hemiscrotum - The left vas was isolated and brought up through the incision exposing that structure. - Bleeding points were cauterized as they occurred. - The vas was free from the surrounding structures and brought into view. - A segment was positioned for placement with a hemostat. - A second hemostat was placed and a small segment between the two hemostats and was removed for inspection. - Each end of the transected vas lumen was fulgurated/obliterated using needlepoint electrocautery. It was then tied off with a #2-0 silk suture -The same procedure was performed on the right. - A suture of #3-0 chromic catgut was used to close each lateral scrotal skin incision  Post-Procedure: - Patient was instructed in care of the operative area - A specimen is to be delivered in 12 and 16 weeks   -Another form of contraception is to be used until he is cleared by the office.   

## 2016-09-28 ENCOUNTER — Other Ambulatory Visit: Payer: BLUE CROSS/BLUE SHIELD

## 2016-09-28 DIAGNOSIS — Z9852 Vasectomy status: Secondary | ICD-10-CM

## 2016-09-29 LAB — POST-VAS SPERM EVALUATION,QUAL
POST-VAS SPERM, QUAL: ABSENT
SEMEN VOLUME: 3.1 mL

## 2016-10-04 ENCOUNTER — Telehealth: Payer: Self-pay

## 2016-10-04 DIAGNOSIS — Z9852 Vasectomy status: Secondary | ICD-10-CM

## 2016-10-04 NOTE — Telephone Encounter (Signed)
Clifford LaserBrian James Budzyn, MD  Skeet Latchhelsea C Roslin Norwood, LPN        Please let patient know semen analysis showed no sperm. He still needs to bring one more in about a month to be sure.    Spoke with pt in reference to semen analysis results. Pt voiced understanding.

## 2017-01-01 IMAGING — US US ABDOMEN LIMITED
1 series · 14 of 25 positions shown · non-contrast
Comparison: None.

CLINICAL DATA: Acute midline thoracic, Back and chest pain.

EXAM:
US ABDOMEN LIMITED - RIGHT UPPER QUADRANT

[Series 1: us abdomen limited · 0.22mm/px · 14 of 67 slices shown]
[im 1/67]
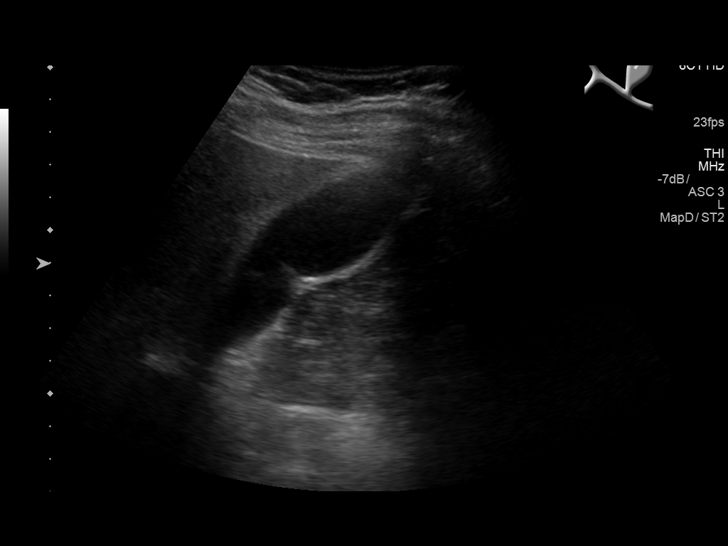
[im 6/67]
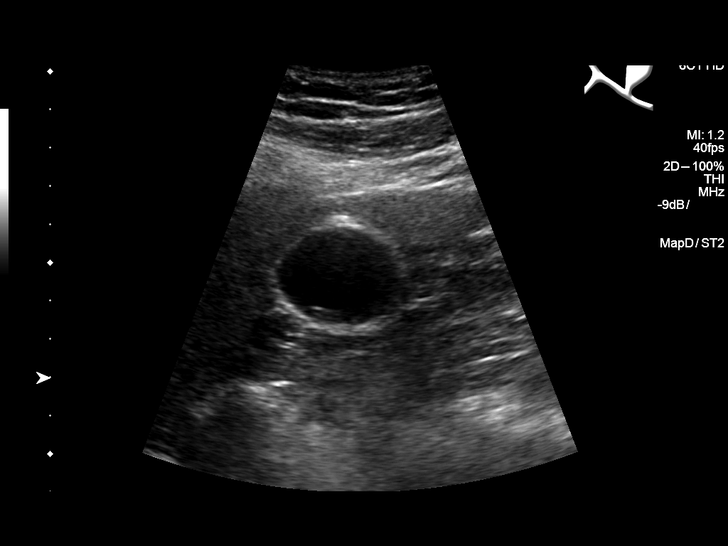
[im 12/67]
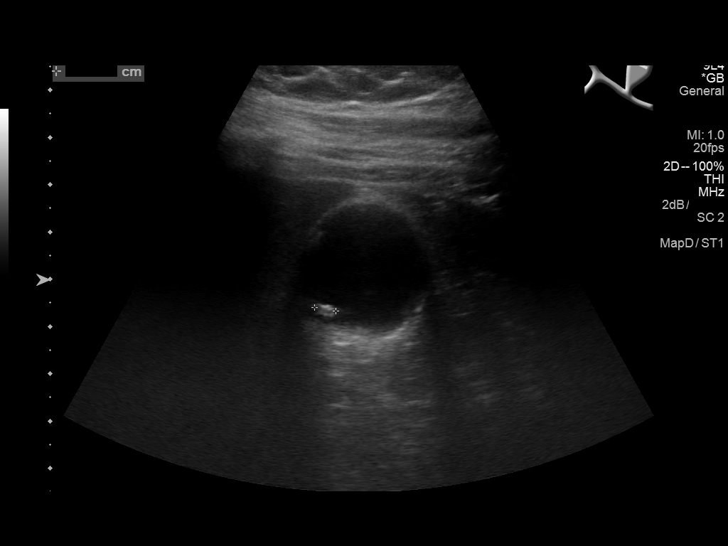
[im 17/67]
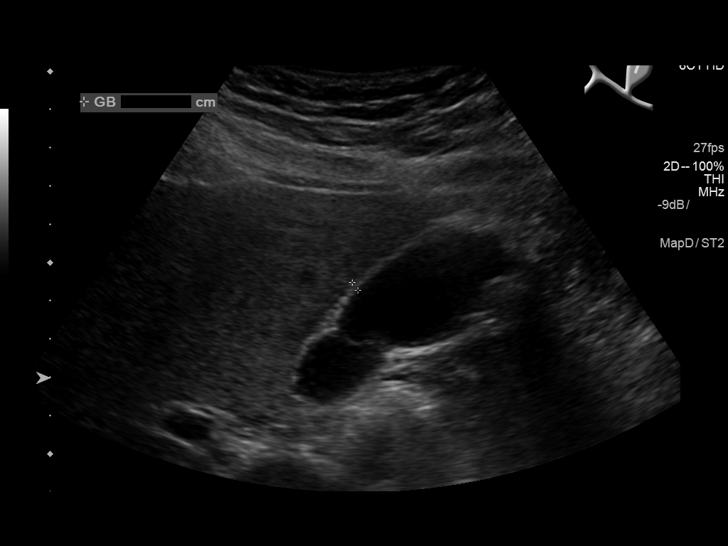
[im 23/67]
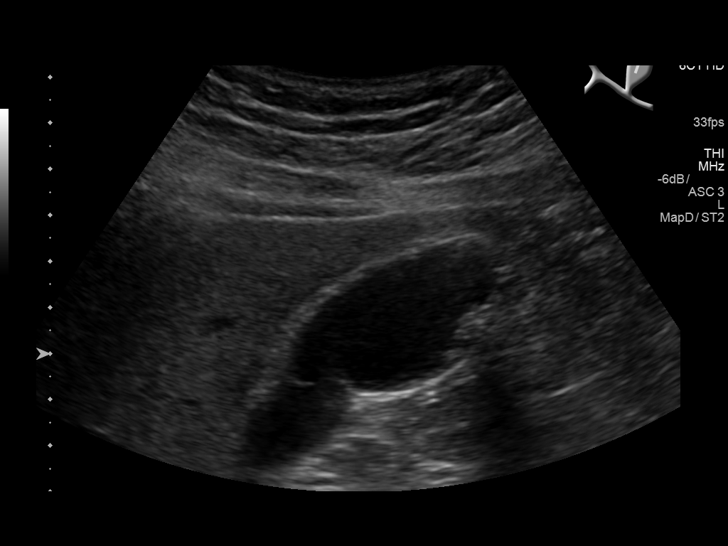
[im 25/67]
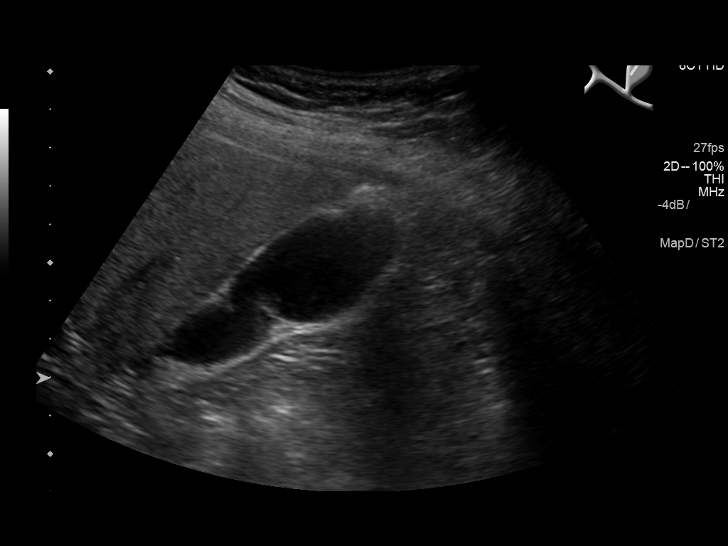
[im 31/67]
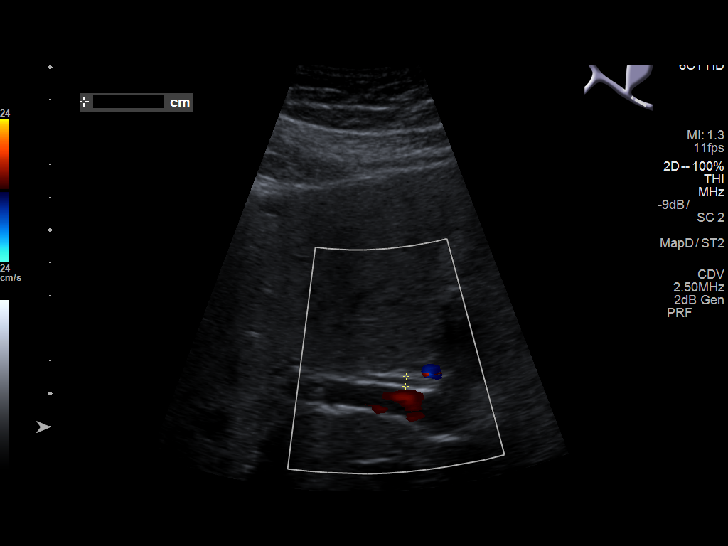
[im 36/67]
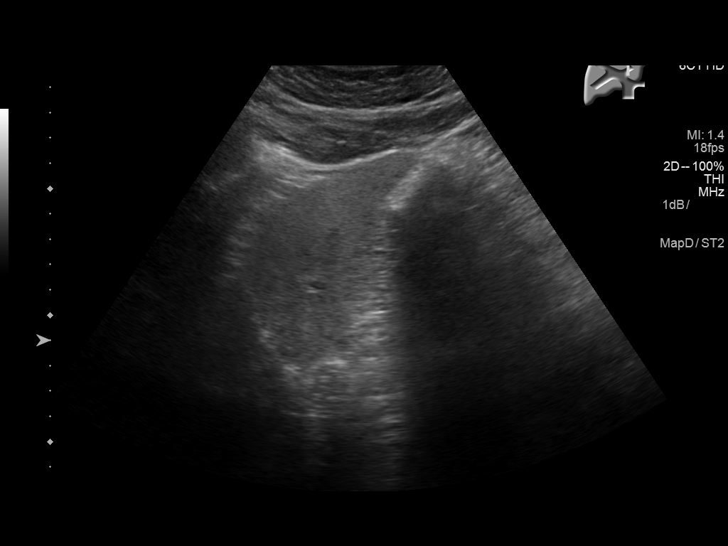
[im 42/67]
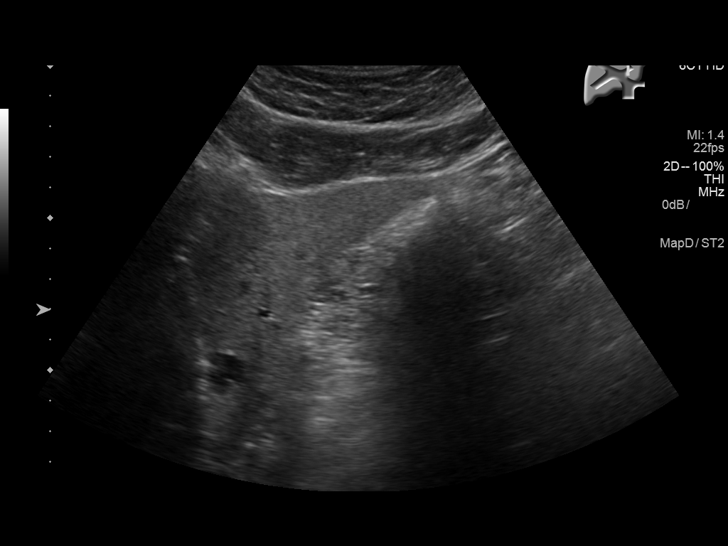
[im 45/67]
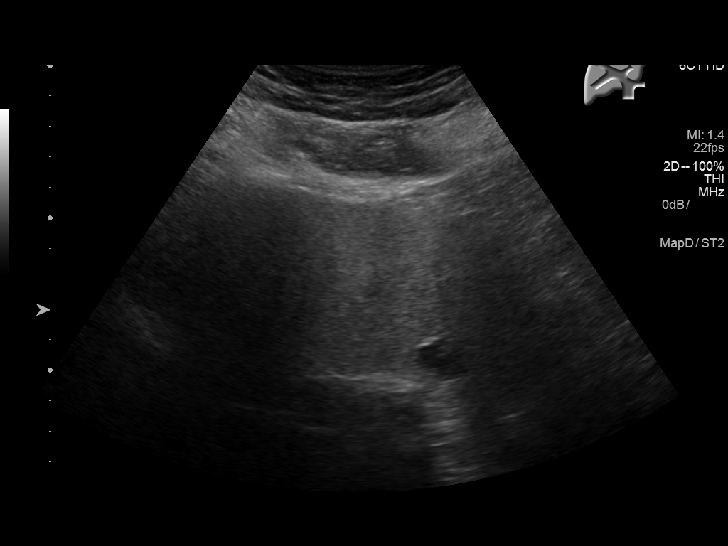
[im 50/67]
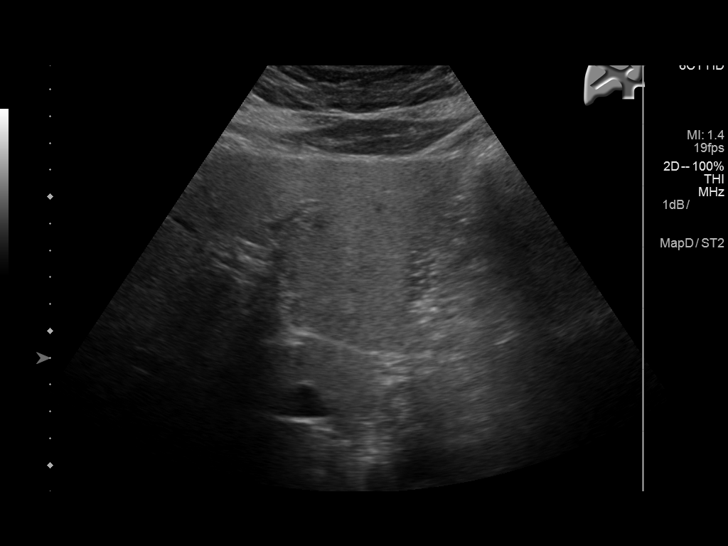
[im 56/67]
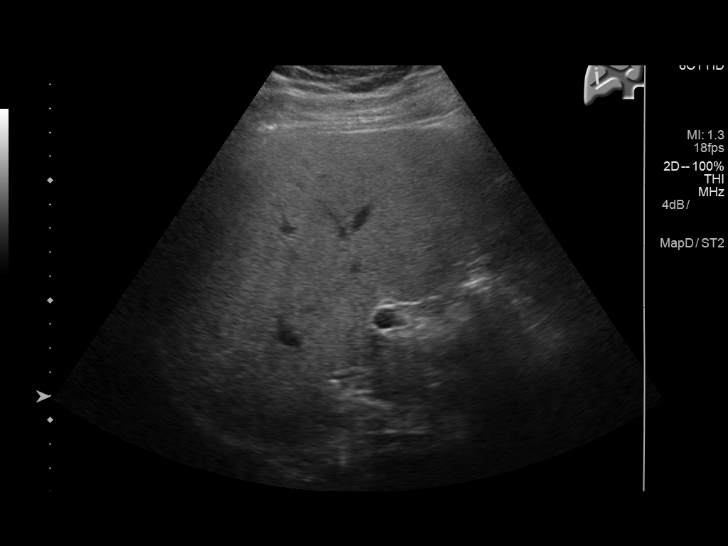
[im 61/67]
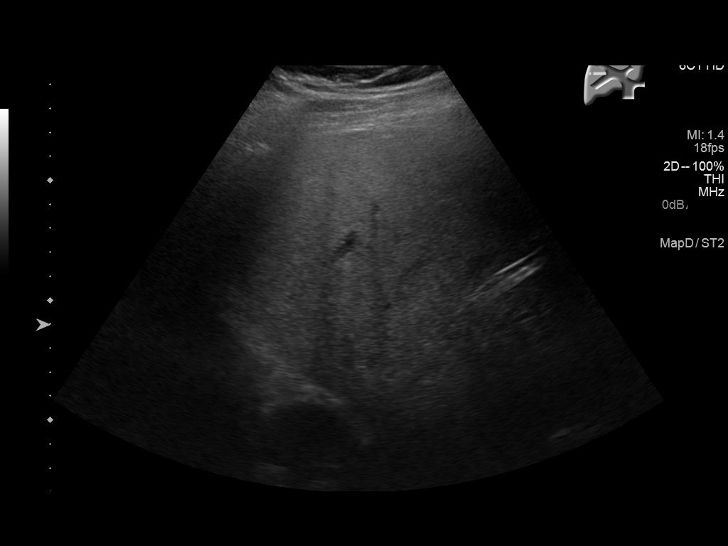
[im 67/67]
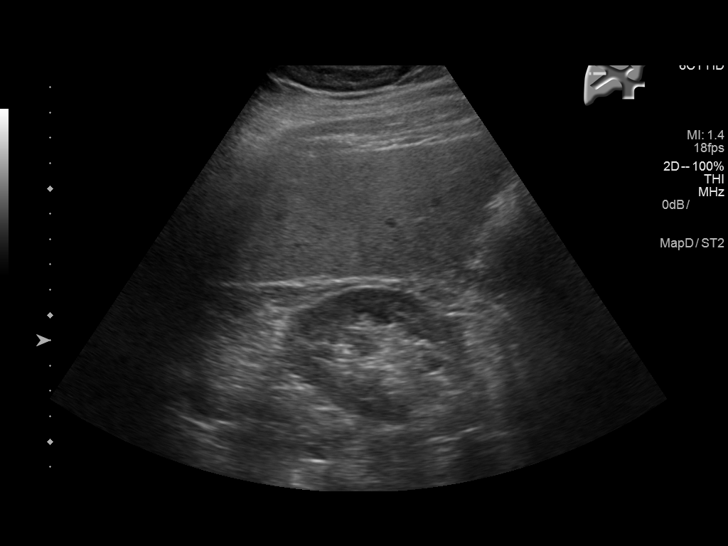

[14 of 25 positions shown; findings below may reference images not displayed]

FINDINGS: Gallbladder:

Gallstones noted measuring up to 5 mm. No gallbladder wall
thickening. Negative sonographic Murphy's sign.

Common bile duct:

Diameter: 3.1 mm

Liver:

Cyst within left lobe of liver measures 1.2 x 1.3 x 1.7 cm.
Increased parenchymal echogenicity noted suggesting hepatic
steatosis.
IMPRESSION: 1. Gallstones.  No gallbladder wall thickening.

## 2017-08-13 NOTE — Progress Notes (Addendum)
08/14/2017 2:11 PM   Clifford Phillips 05/12/1971 [id number]  Referring provider: Lauro RegulusAnderson, Marshall W, MD [address] [facility] [place], [place] [id number]  Chief Complaint  Patient presents with  . Testicle Pain    1wk    HPI: Patient is a 47 year old Caucasian male who presents today for left testicular pain.  He states he had the sudden onset of left testicular pain.  It started one week ago in the afternoon out of the blue.  He denied any scrotal swelling or trauma.  He did note the testicle was in a "different position" at times.    He also stated it felt like liquid was on the inside.  He is not having gross hematuria, dysuria or penile discharge.  He is not having fevers, chills, nausea or vomiting.    He noticed that rest made the pain better and activity made the pain worse.  His UA was negative.     PMH: Past Medical History:  Diagnosis Date  . GERD (gastroesophageal reflux disease)    OCC  . IBS (irritable bowel syndrome)     Surgical History: Past Surgical History:  Procedure Laterality Date  . APPENDECTOMY  1999  . CHOLECYSTECTOMY N/A 03/29/2016   Procedure: LAPAROSCOPIC CHOLECYSTECTOMY WITH INTRAOPERATIVE CHOLANGIOGRAM;  Surgeon: Earline MayotteJeffrey W [name], MD;  Location: [facility] ORS;  Service: General;  Laterality: N/A;  . NASAL SEPTUM SURGERY  2012    Home Medications:  Allergies as of 08/14/2017   No Known Allergies     Medication List    as of 08/14/2017  2:11 PM   You have not been prescribed any medications.     Allergies: No Known Allergies  Family History: Family History  Problem Relation Age of Onset  . Cancer Father 3759       liver cancer  . Prostate cancer Neg Hx     Social History:  reports that he quit smoking about 16 years ago. His smoking use included cigarettes. He has a 10.00 pack-year smoking history. he has never used smokeless tobacco. He reports that he drinks about 0.6 oz of alcohol per week. He reports that he  does not use drugs.  ROS: UROLOGY Frequent Urination?: No Hard to postpone urination?: No Burning/pain with urination?: No Get up at night to urinate?: No Leakage of urine?: No Urine stream starts and stops?: No Trouble starting stream?: No Do you have to strain to urinate?: No Blood in urine?: No Urinary tract infection?: No Sexually transmitted disease?: No Painful intercourse?: No Weak stream?: No Erection problems?: No Penile pain?: No  Gastrointestinal Nausea?: No Vomiting?: No Indigestion/heartburn?: No Diarrhea?: No Constipation?: No  Constitutional Fever: No Night sweats?: No Weight loss?: No Fatigue?: No  Skin Skin rash/lesions?: No Itching?: No  Eyes Blurred vision?: No Double vision?: No  Ears/Nose/Throat Sore throat?: No Sinus problems?: No  Hematologic/Lymphatic Swollen glands?: No Easy bruising?: No  Cardiovascular Leg swelling?: No Chest pain?: No  Respiratory Cough?: No Shortness of breath?: No  Endocrine Excessive thirst?: No  Musculoskeletal Back pain?: No Joint pain?: No  Neurological Headaches?: No Dizziness?: No  Psychologic Depression?: No Anxiety?: No  Physical Exam: BP (!) 155/94   Pulse 86   Ht 6\' 2"  (1.88 m)   Wt 240 lb (108.9 kg)   BMI 30.81 kg/m   Constitutional: Well nourished. Alert and oriented, No acute distress. HEENT: [place] AT, moist mucus membranes. Trachea midline, no masses. Cardiovascular: No clubbing, cyanosis, or  edema. Respiratory: Normal respiratory effort, no increased work of breathing. GI: Abdomen is soft, non tender, non distended, no abdominal masses. Liver and spleen not palpable.  No hernias appreciated.  Stool sample for occult testing is not indicated.   GU: No CVA tenderness.  No bladder fullness or masses.  Patient with circumcised phallus.   Urethral meatus is patent.  No penile discharge. No penile lesions or rashes. Scrotum without lesions, cysts, rashes and/or edema.  Testicles  are located scrotally bilaterally. No masses are appreciated in the testicles. Left and right epididymis are normal. Rectal: Not performed.  Skin: No rashes, bruises or suspicious lesions. Lymph: No cervical or inguinal adenopathy. Neurologic: Grossly intact, no focal deficits, moving all 4 extremities. Psychiatric: Normal mood and affect.    Assessment & Plan:    1. Left testicular pain  - varicocele vs hydrocele vs intermittent torsion - explained to the patient what these conditions were and how they present  - will schedule scrotal ultrasound for further evaluation  - Advised to contact our office or seek treatment in the ED if becomes febrile or pain/ vomiting are difficult control in order to arrange for emergent/urgent intervention    Return for I will call patient with results.  These notes generated with voice recognition software. I apologize for typographical errors.  Michiel Cowboy, PA-C  [facility] [address] [place], [place] [address] [phone]

## 2017-08-14 ENCOUNTER — Ambulatory Visit: Payer: BLUE CROSS/BLUE SHIELD | Admitting: Urology

## 2017-08-14 ENCOUNTER — Encounter: Payer: Self-pay | Admitting: Urology

## 2017-08-14 VITALS — BP 155/94 | HR 86 | Ht 74.0 in | Wt 240.0 lb

## 2017-08-14 DIAGNOSIS — N50812 Left testicular pain: Secondary | ICD-10-CM | POA: Diagnosis not present

## 2017-08-14 LAB — MICROSCOPIC EXAMINATION
Epithelial Cells (non renal): NONE SEEN /hpf (ref 0–10)
WBC UA: NONE SEEN /HPF (ref 0–?)

## 2017-08-14 LAB — URINALYSIS, COMPLETE
Bilirubin, UA: NEGATIVE
GLUCOSE, UA: NEGATIVE
Leukocytes, UA: NEGATIVE
NITRITE UA: NEGATIVE
SPEC GRAV UA: 1.025 (ref 1.005–1.030)
UUROB: 0.2 mg/dL (ref 0.2–1.0)
pH, UA: 5.5 (ref 5.0–7.5)

## 2017-08-14 LAB — BLADDER SCAN AMB NON-IMAGING

## 2018-05-22 IMAGING — XA DG CHOLANGIOGRAM OPERATIVE
1 series · 1 of 1 positions shown · non-contrast
Comparison: Ultrasound 11/04/2015

CLINICAL DATA: 45-year-old male with a history of cholelithiasis

EXAM:
INTRAOPERATIVE CHOLANGIOGRAM
TECHNIQUE: Cholangiographic images from the C-arm fluoroscopic device were
submitted for interpretation post-operatively. Please see the
procedural report for the amount of contrast and the fluoroscopy
time utilized.

[Series 1: ortho standard · 1 of 1 slices shown]
[im 1/1]
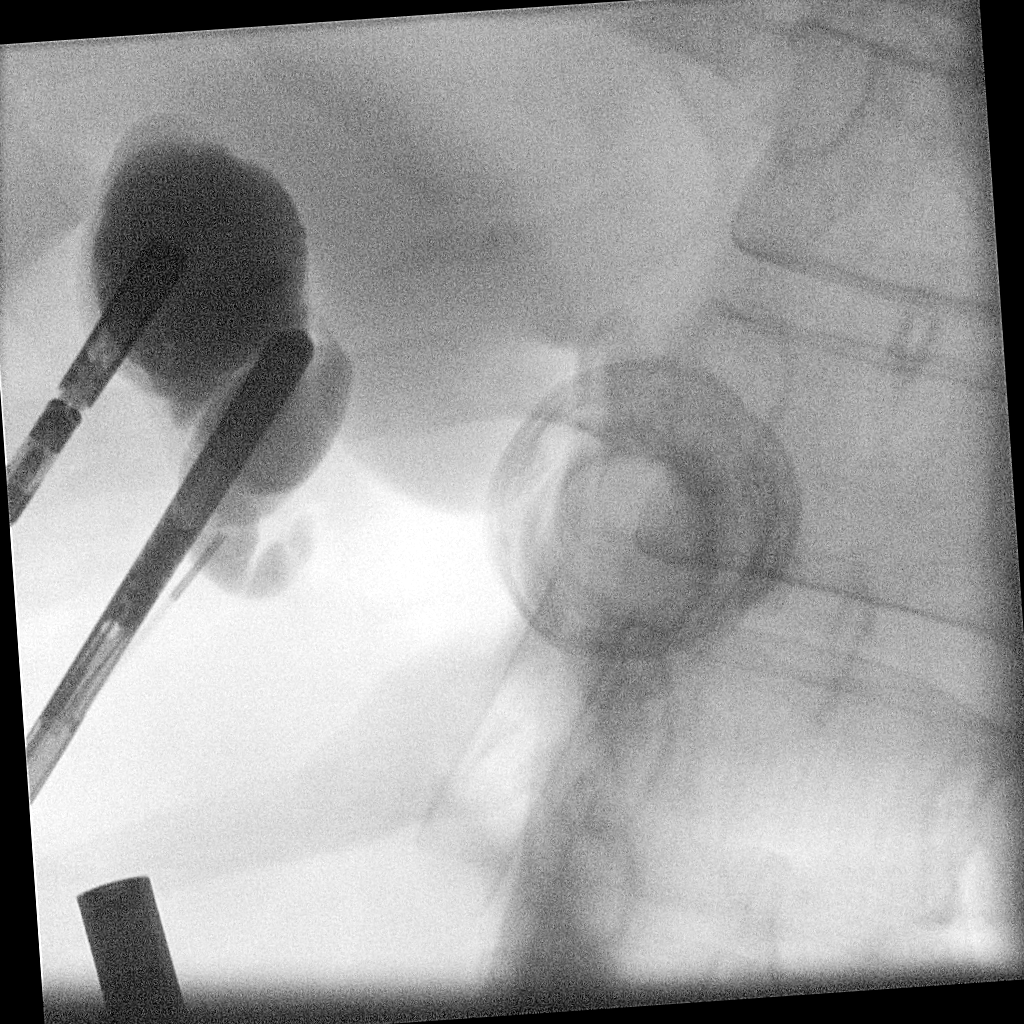

[1 of 1 positions shown; findings below may reference images not displayed]

FINDINGS: Surgical instruments project over the upper abdomen.

There is cannulation of the cystic duct/gallbladder neck, with
antegrade infusion of contrast. Limited images with only 1 image
submitted.

Partial filling of the cystic duct and gallbladder.

No images demonstrating antegrade contrast.
IMPRESSION: Limited images of intraoperative cholangiogram, with partial filling
of the cystic duct.

Please refer to the dictated operative report for full details of
intraoperative findings and procedure

## 2020-08-25 ENCOUNTER — Other Ambulatory Visit
Admission: RE | Admit: 2020-08-25 | Discharge: 2020-08-25 | Disposition: A | Discharge status: Home / Self Care | Payer: Managed Care, Other (non HMO) | Source: Ambulatory Visit | Attending: Internal Medicine | Admitting: Internal Medicine

## 2020-08-25 DIAGNOSIS — R079 Chest pain, unspecified: Secondary | ICD-10-CM | POA: Insufficient documentation

## 2020-08-25 LAB — D-DIMER, QUANTITATIVE (NOT AT ARMC): D-Dimer, Quant: <0.27 ug/mL-FEU (ref 0.00–0.50)

## 2022-07-20 ENCOUNTER — Other Ambulatory Visit: Payer: Self-pay | Admitting: Internal Medicine

## 2022-07-20 DIAGNOSIS — K760 Fatty (change of) liver, not elsewhere classified: Secondary | ICD-10-CM

## 2022-07-27 ENCOUNTER — Ambulatory Visit
Admission: RE | Admit: 2022-07-27 | Discharge: 2022-07-27 | Disposition: A | Discharge status: Home / Self Care | Payer: Managed Care, Other (non HMO) | Source: Ambulatory Visit | Attending: Internal Medicine | Admitting: Internal Medicine

## 2022-07-27 DIAGNOSIS — K760 Fatty (change of) liver, not elsewhere classified: Secondary | ICD-10-CM | POA: Diagnosis present

## 2023-07-24 ENCOUNTER — Other Ambulatory Visit: Payer: Self-pay | Admitting: Internal Medicine

## 2023-07-24 DIAGNOSIS — K760 Fatty (change of) liver, not elsewhere classified: Secondary | ICD-10-CM

## 2023-12-02 ENCOUNTER — Ambulatory Visit
Admission: RE | Admit: 2023-12-02 | Discharge: 2023-12-02 | Disposition: A | Discharge status: Home / Self Care | Payer: Managed Care, Other (non HMO) | Source: Ambulatory Visit | Attending: Internal Medicine | Admitting: Internal Medicine

## 2023-12-02 DIAGNOSIS — K760 Fatty (change of) liver, not elsewhere classified: Secondary | ICD-10-CM | POA: Insufficient documentation
